# Patient Record
Sex: Male | Born: 1975 | Race: White | Hispanic: No | Marital: Single | Smoking: Former smoker
Health system: Southern US, Community
[De-identification: ages and names within clinical notes are randomized; demographics above are authoritative.]

## PROBLEM LIST (undated history)

## (undated) DIAGNOSIS — F32A Depression, unspecified: Secondary | ICD-10-CM

## (undated) DIAGNOSIS — F329 Major depressive disorder, single episode, unspecified: Secondary | ICD-10-CM

## (undated) HISTORY — DX: Depression, unspecified: F32.A

## (undated) HISTORY — DX: Major depressive disorder, single episode, unspecified: F32.9

---

## 2013-03-08 ENCOUNTER — Ambulatory Visit (INDEPENDENT_AMBULATORY_CARE_PROVIDER_SITE_OTHER): Payer: BC Managed Care – PPO | Admitting: *Deleted

## 2013-03-08 DIAGNOSIS — M722 Plantar fascial fibromatosis: Secondary | ICD-10-CM

## 2013-03-08 NOTE — Progress Notes (Signed)
Dispensed patient's orthotics with oral and written instructions for wearing. Patient will follow up with Dr. Al Corpus in 1 month for an orthotic check. Patient interested in 2nd pair. Told pt to let us know if he wanted to order.

## 2013-03-08 NOTE — Patient Instructions (Signed)

## 2013-04-12 ENCOUNTER — Encounter: Payer: Self-pay | Admitting: Podiatry

## 2013-04-12 ENCOUNTER — Ambulatory Visit (INDEPENDENT_AMBULATORY_CARE_PROVIDER_SITE_OTHER): Payer: BC Managed Care – PPO

## 2013-04-12 ENCOUNTER — Ambulatory Visit (INDEPENDENT_AMBULATORY_CARE_PROVIDER_SITE_OTHER): Payer: BC Managed Care – PPO | Admitting: Podiatry

## 2013-04-12 VITALS — BP 130/88 | HR 77 | Resp 16 | Ht 74.0 in | Wt 230.0 lb

## 2013-04-12 DIAGNOSIS — M79672 Pain in left foot: Secondary | ICD-10-CM

## 2013-04-12 DIAGNOSIS — M79609 Pain in unspecified limb: Secondary | ICD-10-CM

## 2013-04-12 DIAGNOSIS — M722 Plantar fascial fibromatosis: Secondary | ICD-10-CM

## 2013-04-12 NOTE — Progress Notes (Signed)
Matthew Carrillo presents today for followup of his plantar fasciitis and the use of his orthotics. He states that his right foot is doing very well however mild left foot is starting to bother me.  Objective: Pulses are strongly palpable bilateral he has no pain on palpation medial continued tubercle of the right foot. His left foot demonstrates pain on palpation medial calcaneal tubercle. Radiographic evaluation demonstrates soft tissue increase in density at the plantar fascial calcaneal insertion site of the left foot.  Assessment: Resolving plantar fasciitis to the right foot with the use of orthotics. Left foot demonstrates plantar fasciitis acute in nature.  Plan: Continue conservative therapies in the use of orthotics. Injected the left heel today with Kenalog and local anesthetic. I will followup with him if needed.

## 2014-09-05 ENCOUNTER — Ambulatory Visit: Payer: Self-pay | Admitting: Podiatry

## 2014-09-12 ENCOUNTER — Ambulatory Visit: Payer: Self-pay | Admitting: Podiatry

## 2014-09-12 ENCOUNTER — Encounter: Payer: Self-pay | Admitting: Podiatry

## 2014-09-12 ENCOUNTER — Ambulatory Visit (INDEPENDENT_AMBULATORY_CARE_PROVIDER_SITE_OTHER): Payer: 59 | Admitting: Podiatry

## 2014-09-12 VITALS — BP 125/73 | HR 66 | Resp 16

## 2014-09-12 DIAGNOSIS — M722 Plantar fascial fibromatosis: Secondary | ICD-10-CM

## 2014-09-12 MED ORDER — MELOXICAM 15 MG PO TABS: 15.0000 mg | ORAL_TABLET | Freq: Every day | ORAL | Status: AC

## 2014-09-12 NOTE — Progress Notes (Signed)
He presents today for follow-up of plantar fasciitis to his left heel. He states that last week he was really killing me and I switched back to my orthotics. He states that the foot is doing much better now and absorbing barest about coming in with no foot pain.  Objective: Signs are stable he is alert and oriented 3. Pulses are palpable bilateral. No pain on palpation medial calcaneal tubercle of the left heel.  Assessment: Well-controlled plantar fasciitis left foot.  Plan: Discussed and orthotics with him particularly graphite orthotics something that would help him with weightlifting. Follow up with him in the near future for orthotic casting.

## 2014-09-12 NOTE — Patient Instructions (Signed)

## 2016-05-01 ENCOUNTER — Encounter: Payer: Self-pay | Admitting: Podiatry

## 2016-05-01 ENCOUNTER — Ambulatory Visit (INDEPENDENT_AMBULATORY_CARE_PROVIDER_SITE_OTHER): Payer: BLUE CROSS/BLUE SHIELD | Admitting: Podiatry

## 2016-05-01 DIAGNOSIS — M722 Plantar fascial fibromatosis: Secondary | ICD-10-CM

## 2016-05-04 NOTE — Progress Notes (Signed)
He presents today for follow-up. He states that feel that I needed  Orthotics these just do not feel right I'm starting to develop pain as I run.  Objective: Vital signs are stable alert and oriented 3. Mild pain on palpation of plantar fascia pulses are palpable all tenderness on palpation of the forefoot left.  Assessment: Fasciitis.  Plan: He was scanned for a new set of orthotics today.

## 2016-06-03 ENCOUNTER — Ambulatory Visit (INDEPENDENT_AMBULATORY_CARE_PROVIDER_SITE_OTHER): Payer: Self-pay

## 2016-06-03 DIAGNOSIS — M722 Plantar fascial fibromatosis: Secondary | ICD-10-CM

## 2016-06-03 NOTE — Patient Instructions (Signed)

## 2016-06-04 NOTE — Progress Notes (Signed)
Patient presents for orthotic pick up.  Verbal and written break in and wear instructions given.  Patient will follow up in 4 weeks if symptoms worsen or fail to improve. 

## 2017-01-29 ENCOUNTER — Other Ambulatory Visit: Payer: Self-pay | Admitting: Nephrology

## 2017-01-29 DIAGNOSIS — F329 Major depressive disorder, single episode, unspecified: Secondary | ICD-10-CM

## 2017-01-29 DIAGNOSIS — N183 Chronic kidney disease, stage 3 unspecified: Secondary | ICD-10-CM

## 2017-01-29 DIAGNOSIS — F32A Depression, unspecified: Secondary | ICD-10-CM

## 2017-01-29 DIAGNOSIS — E291 Testicular hypofunction: Secondary | ICD-10-CM

## 2017-02-10 ENCOUNTER — Ambulatory Visit
Admission: RE | Admit: 2017-02-10 | Discharge: 2017-02-10 | Disposition: A | Discharge status: Home / Self Care | Payer: BLUE CROSS/BLUE SHIELD | Source: Ambulatory Visit | Attending: Nephrology | Admitting: Nephrology

## 2017-02-10 DIAGNOSIS — F32A Depression, unspecified: Secondary | ICD-10-CM

## 2017-02-10 DIAGNOSIS — E291 Testicular hypofunction: Secondary | ICD-10-CM

## 2017-02-10 DIAGNOSIS — N183 Chronic kidney disease, stage 3 unspecified: Secondary | ICD-10-CM

## 2017-02-10 DIAGNOSIS — F329 Major depressive disorder, single episode, unspecified: Secondary | ICD-10-CM

## 2018-03-27 IMAGING — US US RENAL
1 series · 14 of 25 positions shown · non-contrast
Comparison: None.

CLINICAL DATA: Chronic renal disease.

EXAM:
RENAL / URINARY TRACT ULTRASOUND COMPLETE

[Series 1: us renal · 0.23mm/px · 14 of 31 slices shown]
[im 1/31]
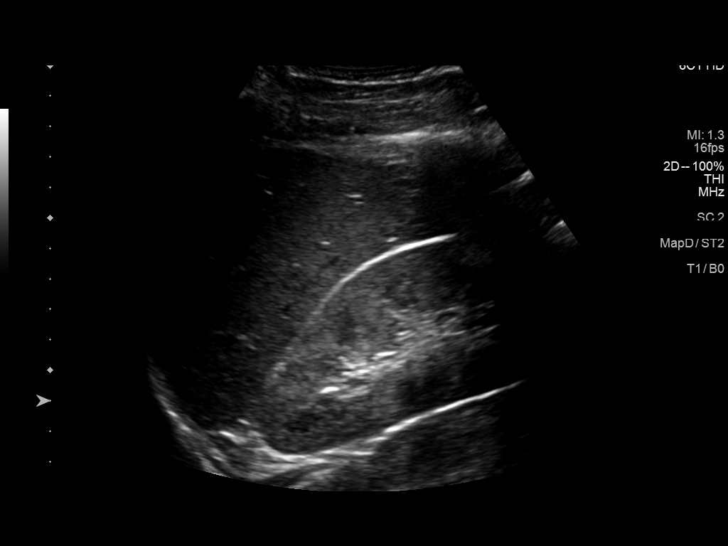
[im 3/31]
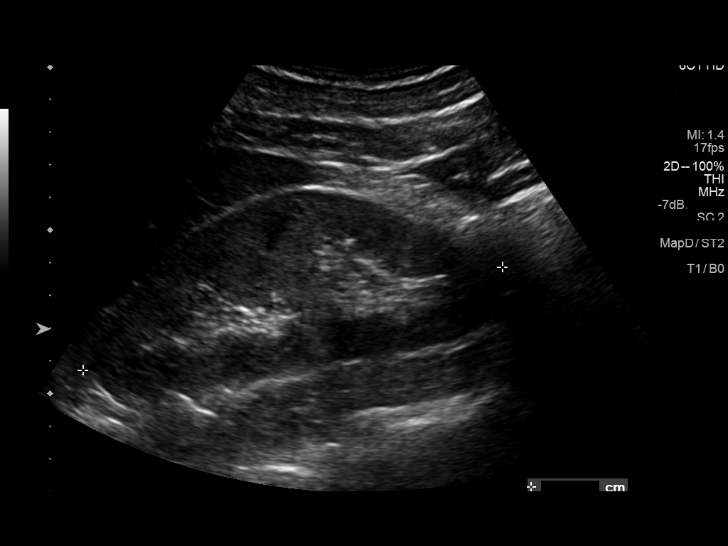
[im 6/31]
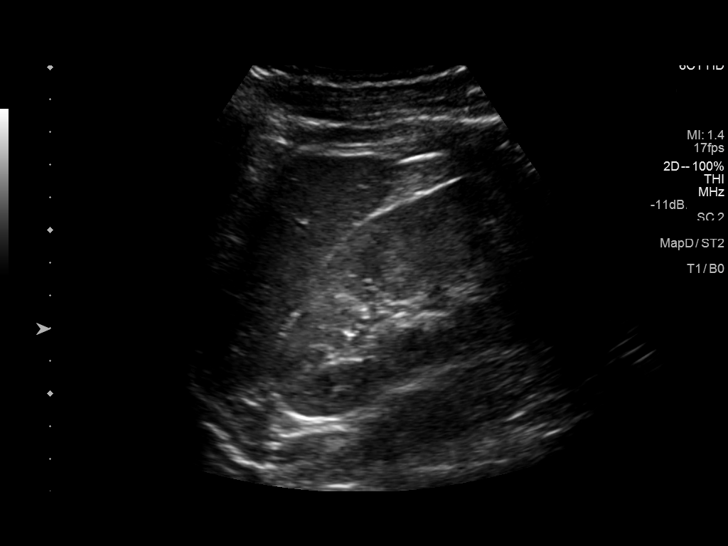
[im 8/31]
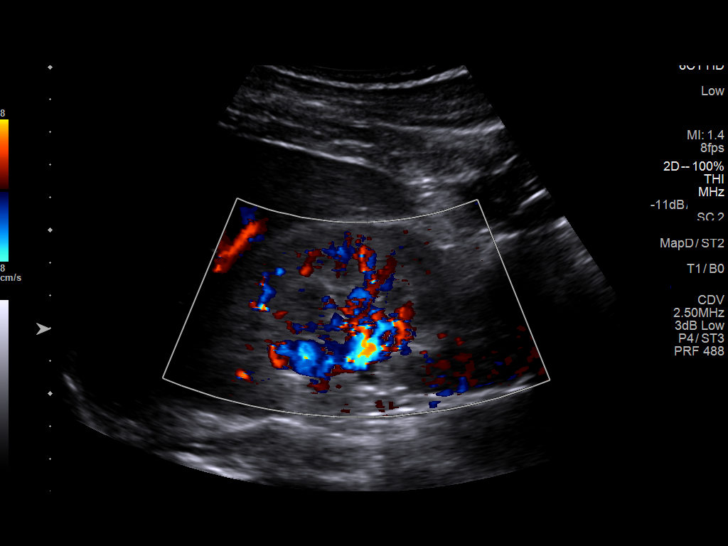
[im 11/31]
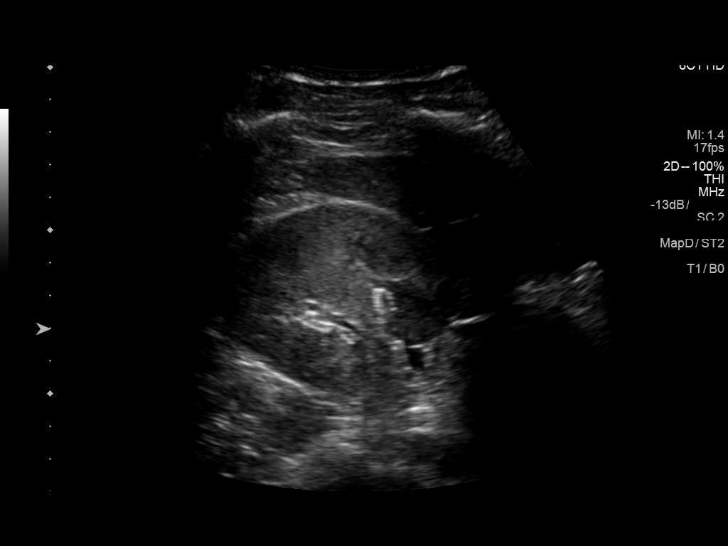
[im 12/31]
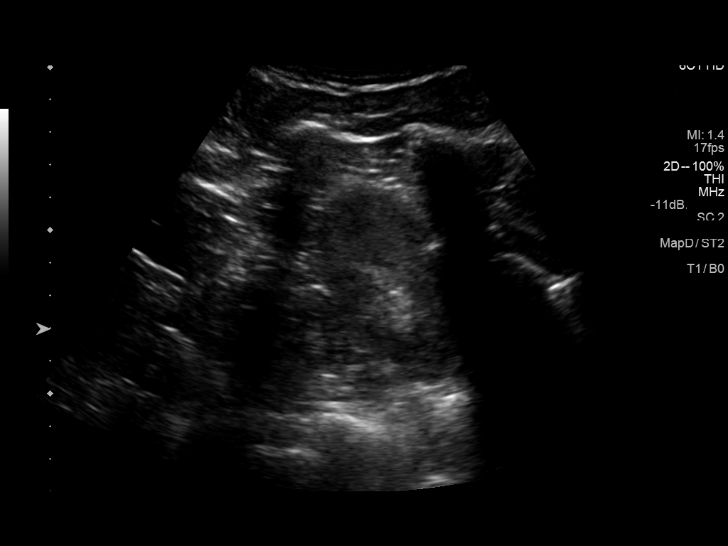
[im 14/31]
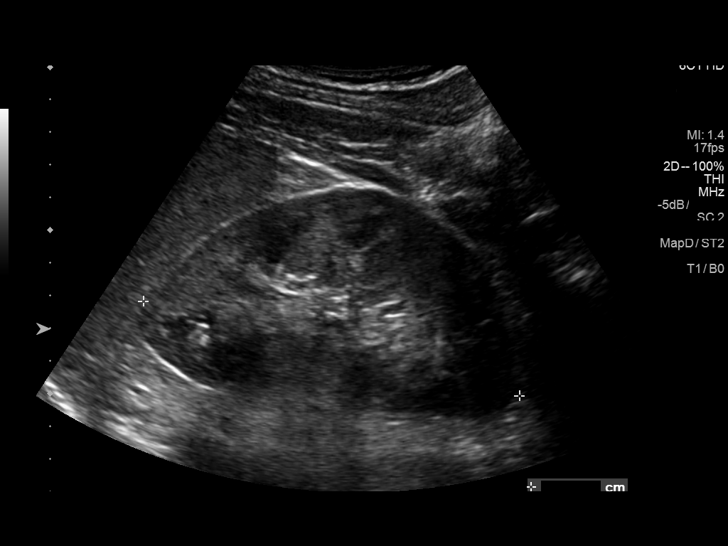
[im 17/31]
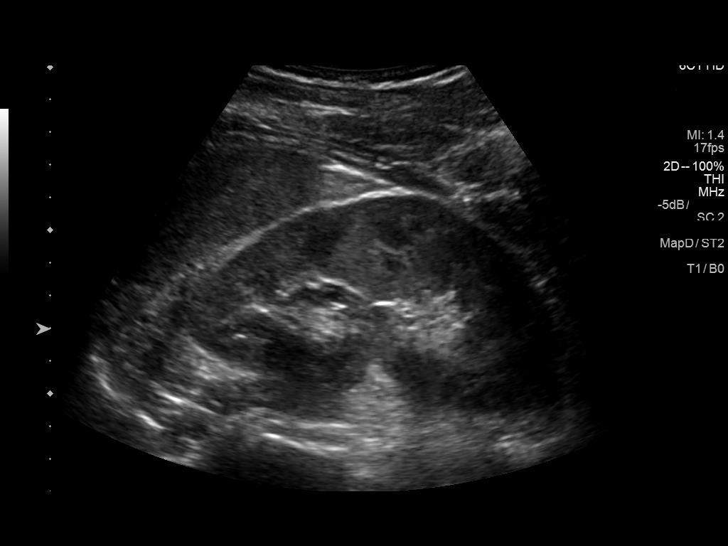
[im 19/31]
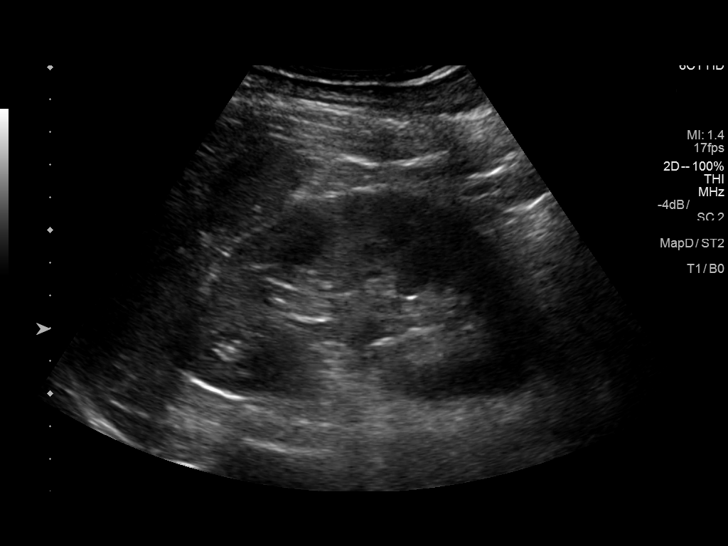
[im 21/31]
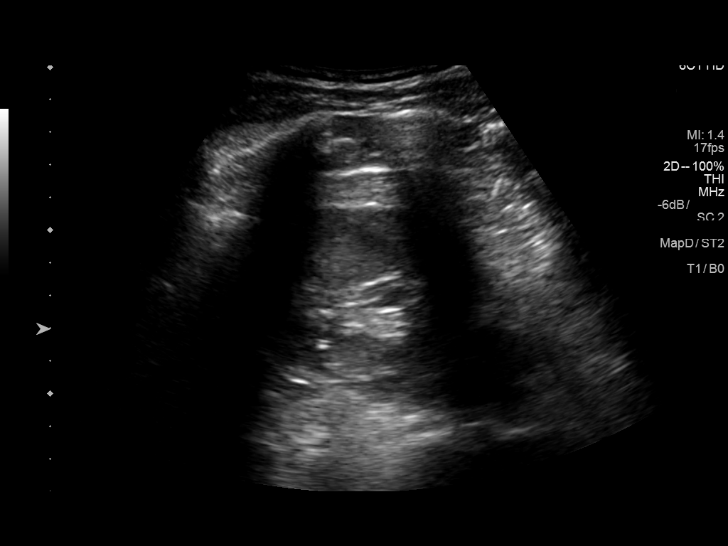
[im 23/31]
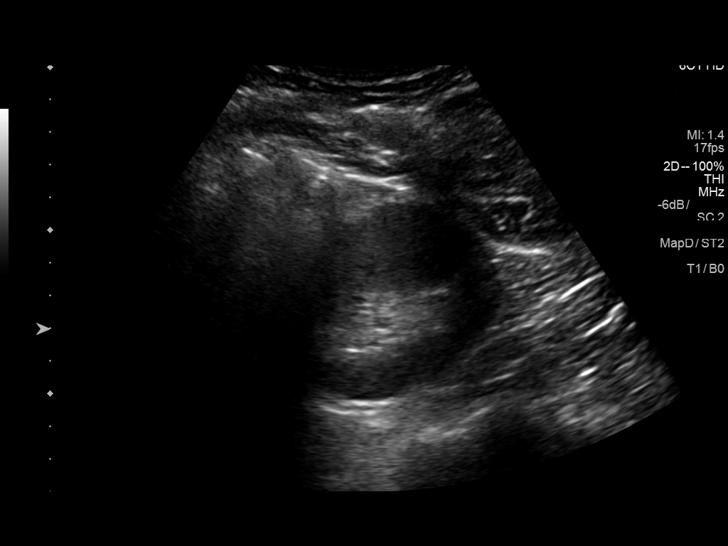
[im 26/31]
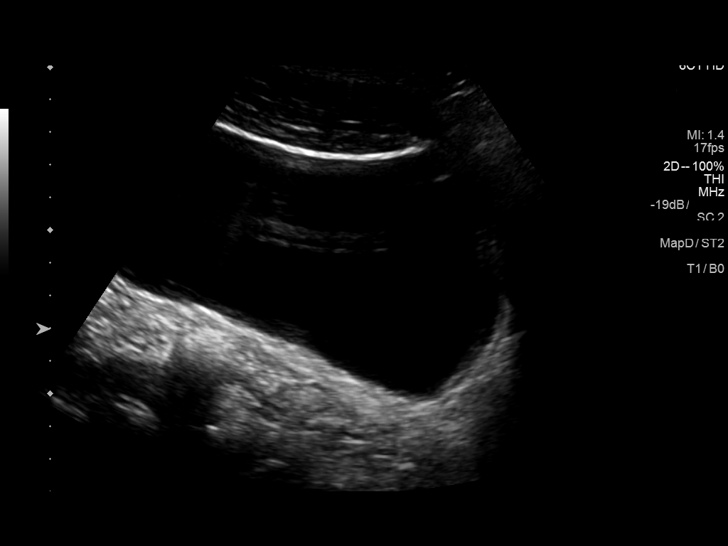
[im 28/31]
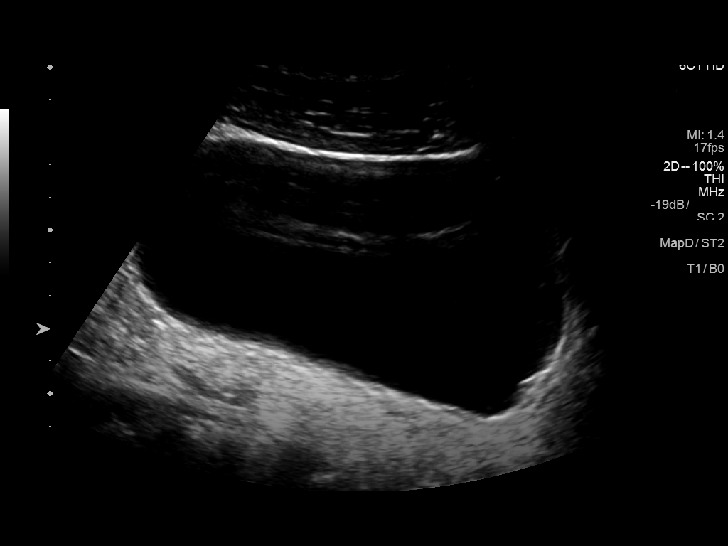
[im 31/31]
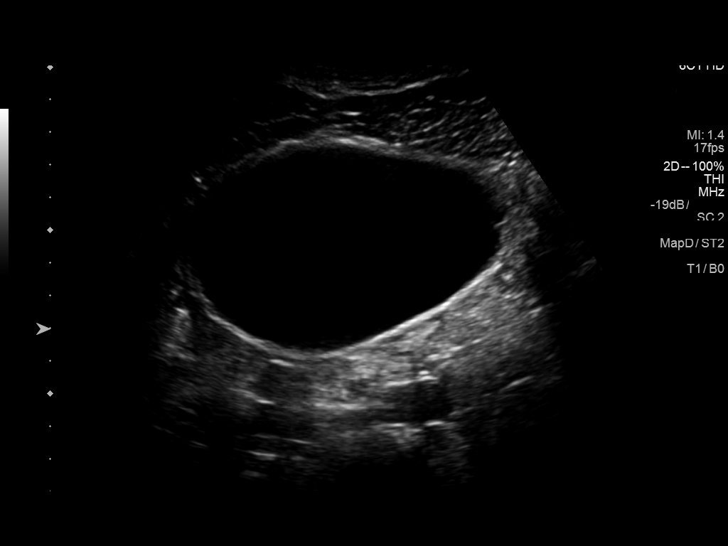

[14 of 25 positions shown; findings below may reference images not displayed]

FINDINGS: Right Kidney:

Length: 12.5 cm. Echogenicity within normal limits. No mass or
hydronephrosis visualized.

Left Kidney:

Length: 12 cm. Echogenicity within normal limits. No mass or
hydronephrosis visualized.

Bladder:

Appears normal for degree of bladder distention.
IMPRESSION: Normal study.  No cause for renal failure identified.
